# Patient Record
Sex: Male | Born: 1975 | Race: White | Hispanic: No | Marital: Single | Smoking: Current every day smoker
Health system: Southern US, Community
[De-identification: ages and names within clinical notes are randomized; demographics above are authoritative.]

## PROBLEM LIST (undated history)

## (undated) HISTORY — PX: KNEE SURGERY: SHX244

---

## 2016-04-28 ENCOUNTER — Encounter (HOSPITAL_COMMUNITY): Payer: Self-pay | Admitting: Internal Medicine

## 2016-04-28 ENCOUNTER — Observation Stay (HOSPITAL_COMMUNITY): Payer: No Typology Code available for payment source

## 2016-04-28 ENCOUNTER — Observation Stay (HOSPITAL_COMMUNITY)
Admission: AD | Admit: 2016-04-28 | Discharge: 2016-04-28 | Disposition: A | Discharge status: Home / Self Care | Payer: No Typology Code available for payment source | Source: Other Acute Inpatient Hospital | Attending: Internal Medicine | Admitting: Internal Medicine

## 2016-04-28 ENCOUNTER — Observation Stay (HOSPITAL_BASED_OUTPATIENT_CLINIC_OR_DEPARTMENT_OTHER): Payer: No Typology Code available for payment source

## 2016-04-28 DIAGNOSIS — F17200 Nicotine dependence, unspecified, uncomplicated: Secondary | ICD-10-CM | POA: Diagnosis not present

## 2016-04-28 DIAGNOSIS — R079 Chest pain, unspecified: Secondary | ICD-10-CM

## 2016-04-28 DIAGNOSIS — R0789 Other chest pain: Secondary | ICD-10-CM

## 2016-04-28 LAB — CBC WITH DIFFERENTIAL/PLATELET
BASOS ABS: 0 10*3/uL (ref 0.0–0.1)
Basophils Relative: 0 %
EOS ABS: 0.1 10*3/uL (ref 0.0–0.7)
Eosinophils Relative: 2 %
HCT: 41.7 % (ref 39.0–52.0)
HEMOGLOBIN: 14.2 g/dL (ref 13.0–17.0)
LYMPHS ABS: 3.3 10*3/uL (ref 0.7–4.0)
LYMPHS PCT: 47 %
MCH: 31.3 pg (ref 26.0–34.0)
MCHC: 34.1 g/dL (ref 30.0–36.0)
MCV: 92.1 fL (ref 78.0–100.0)
MONO ABS: 0.8 10*3/uL (ref 0.1–1.0)
Monocytes Relative: 11 %
NEUTROS ABS: 2.8 10*3/uL (ref 1.7–7.7)
Neutrophils Relative %: 40 %
PLATELETS: 192 10*3/uL (ref 150–400)
RBC: 4.53 MIL/uL (ref 4.22–5.81)
RDW: 12.6 % (ref 11.5–15.5)
WBC: 7 10*3/uL (ref 4.0–10.5)

## 2016-04-28 LAB — TROPONIN I: Troponin I: <0.03 ng/mL (ref <0.03)

## 2016-04-28 LAB — NM MYOCAR MULTI W/SPECT W/WALL MOTION / EF
CHL CUP MPHR: 180 {beats}/min
CSEPEW: 1 METS
CSEPHR: 58 %
CSEPPHR: 106 {beats}/min
Rest HR: 67 {beats}/min

## 2016-04-28 LAB — LIPID PANEL
CHOL/HDL RATIO: 6.3 ratio
Cholesterol: 169 mg/dL (ref 0–200)
HDL: 27 mg/dL — ABNORMAL LOW (ref >40)
LDL Cholesterol: 99 mg/dL (ref 0–99)
Triglycerides: 213 mg/dL — ABNORMAL HIGH (ref <150)
VLDL: 43 mg/dL — ABNORMAL HIGH (ref 0–40)

## 2016-04-28 LAB — MAGNESIUM: MAGNESIUM: 2 mg/dL (ref 1.7–2.4)

## 2016-04-28 MED ORDER — ACETAMINOPHEN 325 MG PO TABS
650.0000 mg | ORAL_TABLET | ORAL | Status: DC | PRN
  Administered 2016-04-28 (×2): 650 mg via ORAL
  Filled 2016-04-28 (×2): qty 2

## 2016-04-28 MED ORDER — GI COCKTAIL ~~LOC~~: 30.0000 mL | Freq: Three times a day (TID) | ORAL | Status: DC | PRN

## 2016-04-28 MED ORDER — PANTOPRAZOLE SODIUM 40 MG PO TBEC
40.0000 mg | DELAYED_RELEASE_TABLET | Freq: Every day | ORAL | Status: DC
  Administered 2016-04-28: 40 mg via ORAL
  Filled 2016-04-28: qty 1

## 2016-04-28 MED ORDER — REGADENOSON 0.4 MG/5ML IV SOLN
INTRAVENOUS | Status: AC
  Administered 2016-04-28: 0.4 mg via INTRAVENOUS
  Filled 2016-04-28: qty 5

## 2016-04-28 MED ORDER — MECLIZINE HCL 25 MG PO TABS: 25.0000 mg | ORAL_TABLET | Freq: Three times a day (TID) | ORAL | Status: DC | PRN

## 2016-04-28 MED ORDER — TECHNETIUM TC 99M TETROFOSMIN IV KIT
10.0000 | PACK | Freq: Once | INTRAVENOUS | Status: AC | PRN
  Administered 2016-04-28: 10 via INTRAVENOUS

## 2016-04-28 MED ORDER — TECHNETIUM TC 99M TETROFOSMIN IV KIT
30.0000 | PACK | Freq: Once | INTRAVENOUS | Status: AC | PRN
  Administered 2016-04-28: 30 via INTRAVENOUS

## 2016-04-28 MED ORDER — REGADENOSON 0.4 MG/5ML IV SOLN
0.4000 mg | Freq: Once | INTRAVENOUS | Status: AC
  Administered 2016-04-28: 0.4 mg via INTRAVENOUS
  Filled 2016-04-28: qty 5

## 2016-04-28 MED ORDER — PANTOPRAZOLE SODIUM 40 MG PO TBEC
40.0000 mg | DELAYED_RELEASE_TABLET | Freq: Every day | ORAL | 3 refills | Status: AC
Start: 2016-04-28 — End: ?

## 2016-04-28 MED ORDER — ENOXAPARIN SODIUM 60 MG/0.6ML ~~LOC~~ SOLN
60.0000 mg | SUBCUTANEOUS | Status: DC
  Administered 2016-04-28: 60 mg via SUBCUTANEOUS
  Filled 2016-04-28: qty 0.6

## 2016-04-28 MED ORDER — MORPHINE SULFATE (PF) 2 MG/ML IV SOLN
2.0000 mg | INTRAVENOUS | Status: DC | PRN
Start: 2016-04-28 — End: 2016-04-28

## 2016-04-28 MED ORDER — ONDANSETRON HCL 4 MG/2ML IJ SOLN: 4.0000 mg | Freq: Four times a day (QID) | INTRAMUSCULAR | Status: DC | PRN

## 2016-04-28 NOTE — Progress Notes (Signed)
I have seen and assessed patient and agree with Dr Boston Service assessment and plan. Patient has been seen by cardiology and patient for stress myoview today.

## 2016-04-28 NOTE — Discharge Summary (Signed)
Physician Discharge Summary  Jerry Cline ZOX:[id number] DOB: 04-06-1976 DOA: 04/28/2016  PCP: No PCP Per Patient  Admit date: 04/28/2016 Discharge date: 04/28/2016  Time spent: 65 minutes  Recommendations for Outpatient Follow-up:  1. Follow-up with PCP in 1-2 weeks.   Discharge Diagnoses:  Principal Problem:   Chest pain   Discharge Condition: Stable  Diet recommendation: Regular  Filed Weights   04/28/16 0257  Weight: 121.6 kg (268 lb)    History of present illness:  Per Dr Shirley Friar Jerry Cline is a 40 y.o. male with medical history significant of not seeing a doctor regularly, but healthy as far as he knows.  Patient presented to the ED at [facility] the evening of admission, for c/o chest pain.  Symptoms onset earlier in the day prior to admission, have been intermittent since onset.  Pain located in center left chest.  Quality is burning and dull.  Occasionally radiates to L arm.  No prior CP or cardiac work up in past, no SOB.  ED Course: Trop negative, EKG was read as 'PR depression'.  Patient transferred to [facility] due to cardiology, stress test, and 2d echo all being currently unavailable at [facility].  Hospital Course:  #1 chest pain Patient had presented with chest pain with radiation to the left upper extremity which seems somewhat atypical. Patient however had several risk factors for coronary artery disease including obesity, poor diet, unknown lipid levels patient also noted to be adopted. EKG done at outside hospital, showed early repolarization with a normal sinus rhythm and no ischemic changes noted. Patient was admitted to the telemetry floor. Cardiac enzymes which was cycled were negative 3. Fasting lipid panel obtained had a LDL of 99 and a total cholesterol 169. Patient was seen in consultation by cardiology who recommended a Myoview stress test. Patient remained chest pain-free for the rest of the hospitalization. Patient was also maintained on a  PPI. Myoview stress test came back with no reversible ischemia or infarction. Normal left ventricular wall motion. EF of 53%. Low risk stress test findings. Patient improved clinically patient was discharged home in stable and improved condition on a PPI. Patient is to follow-up with PCP in 1-2 weeks postdischarge.  Procedures:  CXR 04/28/2016  Myoview stress test 04/28/2016   Consultations:  Cardiology: Dr Elease Hashimoto 04/28/2016  Discharge Exam: Vitals:   04/28/16 1204 04/28/16 1549  BP: 115/84 118/77  Pulse:  78  Resp:  17  Temp:  98.4 F (36.9 C)    General: NAD Cardiovascular: RRR Respiratory: CTAB  Discharge Instructions   Discharge Instructions    Diet general    Complete by:  As directed   Discharge instructions    Complete by:  As directed   Follow up with PCP   Increase activity slowly    Complete by:  As directed     Current Discharge Medication List    START taking these medications   Details  pantoprazole (PROTONIX) 40 MG tablet Take 1 tablet (40 mg total) by mouth daily at 6 (six) AM. Qty: 30 tablet, Refills: 3       Allergies  Allergen Reactions  . Bee Venom Anaphylaxis   Follow-up Information    pcp. Schedule an appointment as soon as possible for a visit in 2 week(s).   Why:  f/u with PCP in 1-2 weeks.           The results of significant diagnostics from this hospitalization (including imaging, microbiology, ancillary and laboratory) are listed  below for reference.    Significant Diagnostic Studies: Dg Chest 2 View  Result Date: 04/28/2016 CLINICAL DATA:  Chest pain for 2 days EXAM: CHEST  2 VIEW COMPARISON:  04/27/2016 FINDINGS: The heart size and mediastinal contours are within normal limits. Both lungs are clear. The visualized skeletal structures are unremarkable. IMPRESSION: No active cardiopulmonary disease. Electronically Signed   By: Alcide Clever M.D.   On: 04/28/2016 09:56  Nm Myocar Multi W/spect W/wall Motion / Ef  Result Date:  04/28/2016 CLINICAL DATA:  Chest pain, tobacco abuse EXAM: MYOCARDIAL IMAGING WITH SPECT (REST AND PHARMACOLOGIC-STRESS) GATED LEFT VENTRICULAR WALL MOTION STUDY LEFT VENTRICULAR EJECTION FRACTION TECHNIQUE: Standard myocardial SPECT imaging was performed after resting intravenous injection of 10 mCi Tc-51m sestamibi. Subsequently, intravenous infusion of Lexiscan was performed under the supervision of the Cardiology staff. At peak effect of the drug, 30 mCi Tc-28m sestamibi was injected intravenously and standard myocardial SPECT imaging was performed. Quantitative gated imaging was also performed to evaluate left ventricular wall motion, and estimate left ventricular ejection fraction. COMPARISON:  None. FINDINGS: Perfusion: No decreased activity in the left ventricle on stress imaging to suggest reversible ischemia or infarction. Mild inferoseptal attenuation. Wall Motion: Normal left ventricular wall motion. No left ventricular dilation. Left Ventricular Ejection Fraction: 53 % End diastolic volume 129 ml End systolic volume 61 ml IMPRESSION: 1. No reversible ischemia or infarction. 2. Normal left ventricular wall motion. 3. Left ventricular ejection fraction 53% 4. Low-risk stress test findings*. *2012 Appropriate Use Criteria for Coronary Revascularization Focused Update: J Am Coll Cardiol. 2012;59(9):857-881. http://[email or web address] Electronically Signed   By: Corlis Leak M.D.   On: 04/28/2016 15:59    Microbiology: No results found for this or any previous visit (from the past 240 hour(s)).   Labs: Basic Metabolic Panel:  Recent Labs Lab 04/28/16 0925  MG 2.0   Liver Function Tests: No results for input(s): AST, ALT, ALKPHOS, BILITOT, PROT, ALBUMIN in the last 168 hours. No results for input(s): LIPASE, AMYLASE in the last 168 hours. No results for input(s): AMMONIA in the last 168 hours. CBC:  Recent Labs Lab 04/28/16 0925  WBC 7.0  NEUTROABS 2.8   HGB 14.2  HCT 41.7  MCV 92.1  PLT 192   Cardiac Enzymes:  Recent Labs Lab 04/28/16 0458 04/28/16 0656 04/28/16 0925 04/28/16 1453  TROPONINI <0.03 <0.03 <0.03 <0.03   BNP: BNP (last 3 results) No results for input(s): BNP in the last 8760 hours.  ProBNP (last 3 results) No results for input(s): PROBNP in the last 8760 hours.  CBG: No results for input(s): GLUCAP in the last 168 hours.     SignedRamiro Harvest MD.  [facility] 04/28/2016, 4:46 PM

## 2016-04-28 NOTE — H&P (Signed)
History and Physical    Jerry Cline ZOX:[id number] DOB: 15-Dec-1975 DOA: 04/28/2016   PCP: No PCP Per Patient  Chief Complaint: Chest pain  HPI: Jerry Cline is a 40 y.o. male with medical history significant of not seeing a doctor regularly, but healthy as far as he knows.  Patient presented to the ED at [facility] this evening for c/o chest pain.  Symptoms onset earlier in the day yesterday, have been intermittent since onset.  Pain located in center left chest.  Quality is burning and dull.  Occasionally radiates to L arm.  No prior CP or cardiac work up in past, no SOB.  ED Course: Trop negative, EKG was read as 'PR depression'.  Patient transferred to [facility] due to cardiology, stress test, and 2d echo all being currently unavailable at [facility].  Review of Systems: As per HPI otherwise 10 point review of systems negative.    History reviewed. No pertinent past medical history.  Past Surgical History:  Procedure Laterality Date  . KNEE SURGERY     x4     reports that he has been smoking.  He does not have any smokeless tobacco history on file. He reports that he drinks alcohol. He reports that he does not use drugs.  No Known Allergies  Family History  Problem Relation Age of Onset  . Adopted: Yes      Prior to Admission medications   Not on File    Physical Exam: Vitals:   04/28/16 0257  BP: 122/75  Pulse: 74  Resp: 14  Temp: 97.6 F (36.4 C)  TempSrc: Oral  SpO2: 99%  Weight: 121.6 kg (268 lb)  Height: 6' (1.829 m)      Constitutional: NAD, calm, comfortable Eyes: PERRL, lids and conjunctivae normal ENMT: Mucous membranes are moist. Posterior pharynx clear of any exudate or lesions.Normal dentition.  Neck: normal, supple, no masses, no thyromegaly Respiratory: clear to auscultation bilaterally, no wheezing, no crackles. Normal respiratory effort. No accessory muscle use.  Cardiovascular: Regular rate and rhythm, no murmurs / rubs /  gallops. No extremity edema. 2+ pedal pulses. No carotid bruits.  Abdomen: no tenderness, no masses palpated. No hepatosplenomegaly. Bowel sounds positive.  Musculoskeletal: no clubbing / cyanosis. No joint deformity upper and lower extremities. Good ROM, no contractures. Normal muscle tone.  Skin: no rashes, lesions, ulcers. No induration Neurologic: CN 2-12 grossly intact. Sensation intact, DTR normal. Strength 5/5 in all 4.  Psychiatric: Normal judgment and insight. Alert and oriented x 3. Normal mood.    Labs on Admission: I have personally reviewed following labs and imaging studies  CBC: No results for input(s): WBC, NEUTROABS, HGB, HCT, MCV, PLT in the last 168 hours. Basic Metabolic Panel: No results for input(s): NA, K, CL, CO2, GLUCOSE, BUN, CREATININE, CALCIUM, MG, PHOS in the last 168 hours. GFR: CrCl cannot be calculated (No order found.). Liver Function Tests: No results for input(s): AST, ALT, ALKPHOS, BILITOT, PROT, ALBUMIN in the last 168 hours. No results for input(s): LIPASE, AMYLASE in the last 168 hours. No results for input(s): AMMONIA in the last 168 hours. Coagulation Profile: No results for input(s): INR, PROTIME in the last 168 hours. Cardiac Enzymes: No results for input(s): CKTOTAL, CKMB, CKMBINDEX, TROPONINI in the last 168 hours. BNP (last 3 results) No results for input(s): PROBNP in the last 8760 hours. HbA1C: No results for input(s): HGBA1C in the last 72 hours. CBG: No results for input(s): GLUCAP in the last 168 hours. Lipid Profile:  No results for input(s): CHOL, HDL, LDLCALC, TRIG, CHOLHDL, LDLDIRECT in the last 72 hours. Thyroid Function Tests: No results for input(s): TSH, T4TOTAL, FREET4, T3FREE, THYROIDAB in the last 72 hours. Anemia Panel: No results for input(s): VITAMINB12, FOLATE, FERRITIN, TIBC, IRON, RETICCTPCT in the last 72 hours. Urine analysis: No results found for: COLORURINE, APPEARANCEUR, LABSPEC, PHURINE, GLUCOSEU, HGBUR,  BILIRUBINUR, KETONESUR, PROTEINUR, UROBILINOGEN, NITRITE, LEUKOCYTESUR Sepsis Labs: @LABRCNTIP (procalcitonin:4,lacticidven:4) )No results found for this or any previous visit (from the past 240 hour(s)).   Radiological Exams on Admission: No results found.  EKG: Independently reviewed.  I personally reviewed the outside EKGs, no obvious STEMI  Assessment/Plan Principal Problem:   Chest pain   Chest pain -  CP obs pathway  Serial trops  NPO  Tele monitor  Stress test in AM  Morphine PRN pain   DVT prophylaxis: Lovenox Code Status: Full Family Communication: No family in room Consults called: None Admission status: Admit to obs   Hillary Bow DO [facility] Pager [phone] from 7PM-7AM  If 7AM-7PM, please contact the day physician for the patient [email or web address] Password [email or web address]  04/28/2016, 4:30 AM

## 2016-04-28 NOTE — Discharge Instructions (Signed)
Heart-Healthy Eating Plan °Many factors influence your heart health, including eating and exercise habits. Heart (coronary) risk increases with abnormal blood fat (lipid) levels. Heart-healthy meal planning includes limiting unhealthy fats, increasing healthy fats, and making other small dietary changes. This includes maintaining a healthy body weight to help keep lipid levels within a normal range. °WHAT IS MY PLAN?  °Your health care provider recommends that you: °· Get no more than _________% of the total calories in your daily diet from fat. °· Limit your intake of saturated fat to less than _________% of your total calories each day. °· Limit the amount of cholesterol in your diet to less than _________ mg per day. °WHAT TYPES OF FAT SHOULD I CHOOSE? °· Choose healthy fats more often. Choose monounsaturated and polyunsaturated fats, such as olive oil and canola oil, flaxseeds, walnuts, almonds, and seeds. °· Eat more omega-3 fats. Good choices include salmon, mackerel, sardines, tuna, flaxseed oil, and ground flaxseeds. Aim to eat fish at least two times each week. °· Limit saturated fats. Saturated fats are primarily found in animal products, such as meats, butter, and cream. Plant sources of saturated fats include palm oil, palm kernel oil, and coconut oil. °· Avoid foods with partially hydrogenated oils in them. These contain trans fats. Examples of foods that contain trans fats are stick margarine, some tub margarines, cookies, crackers, and other baked goods. °WHAT GENERAL GUIDELINES DO I NEED TO FOLLOW? °· Check food labels carefully to identify foods with trans fats or high amounts of saturated fat. °· Fill one half of your plate with vegetables and green salads. Eat 4-5 servings of vegetables per day. A serving of vegetables equals 1 cup of raw leafy vegetables, ½ cup of raw or cooked cut-up vegetables, or ½ cup of vegetable juice. °· Fill one fourth of your plate with whole grains. Look for the word  "whole" as the first word in the ingredient list. °· Fill one fourth of your plate with lean protein foods. °· Eat 4-5 servings of fruit per day. A serving of fruit equals one medium whole fruit, ¼ cup of dried fruit, ½ cup of fresh, frozen, or canned fruit, or ½ cup of 100% fruit juice. °· Eat more foods that contain soluble fiber. Examples of foods that contain this type of fiber are apples, broccoli, carrots, beans, peas, and barley. Aim to get 20-30 g of fiber per day. °· Eat more home-cooked food and less restaurant, buffet, and fast food. °· Limit or avoid alcohol. °· Limit foods that are high in starch and sugar. °· Avoid fried foods. °· Cook foods by using methods other than frying. Baking, boiling, grilling, and broiling are all great options. Other fat-reducing suggestions include: °¨ Removing the skin from poultry. °¨ Removing all visible fats from meats. °¨ Skimming the fat off of stews, soups, and gravies before serving them. °¨ Steaming vegetables in water or broth. °· Lose weight if you are overweight. Losing just 5-10% of your initial body weight can help your overall health and prevent diseases such as diabetes and heart disease. °· Increase your consumption of nuts, legumes, and seeds to 4-5 servings per week. One serving of dried beans or legumes equals ½ cup after being cooked, one serving of nuts equals 1½ ounces, and one serving of seeds equals ½ ounce or 1 tablespoon. °· You may need to monitor your salt (sodium) intake, especially if you have high blood pressure. Talk with your health care provider or dietitian to get   more information about reducing sodium. °WHAT FOODS CAN I EAT? °Grains °Breads, including French, white, pita, wheat, raisin, rye, oatmeal, and Italian. Tortillas that are neither fried nor made with lard or trans fat. Low-fat rolls, including hotdog and hamburger buns and English muffins. Biscuits. Muffins. Waffles. Pancakes. Light popcorn. Whole-grain cereals. Flatbread. Melba  toast. Pretzels. Breadsticks. Rusks. Low-fat snacks and crackers, including oyster, saltine, matzo, graham, animal, and rye. Rice and pasta, including brown rice and those that are made with whole wheat. °Vegetables °All vegetables. °Fruits °All fruits, but limit coconut. °Meats and Other Protein Sources °Lean, well-trimmed beef, veal, pork, and lamb. Chicken and turkey without skin. All fish and shellfish. Wild duck, rabbit, pheasant, and venison. Egg whites or low-cholesterol egg substitutes. Dried beans, peas, lentils, and tofu. Seeds and most nuts. °Dairy °Low-fat or nonfat cheeses, including ricotta, string, and mozzarella. Skim or 1% milk that is liquid, powdered, or evaporated. Buttermilk that is made with low-fat milk. Nonfat or low-fat yogurt. °Beverages °Mineral water. Diet carbonated beverages. °Sweets and Desserts °Sherbets and fruit ices. Honey, jam, marmalade, jelly, and syrups. Meringues and gelatins. Pure sugar candy, such as hard candy, jelly beans, gumdrops, mints, marshmallows, and small amounts of dark chocolate. Angel food cake. °Eat all sweets and desserts in moderation. °Fats and Oils °Nonhydrogenated (trans-free) margarines. Vegetable oils, including soybean, sesame, sunflower, olive, peanut, safflower, corn, canola, and cottonseed. Salad dressings or mayonnaise that are made with a vegetable oil. Limit added fats and oils that you use for cooking, baking, salads, and as spreads. °Other °Cocoa powder. Coffee and tea. All seasonings and condiments. °The items listed above may not be a complete list of recommended foods or beverages. Contact your dietitian for more options. °WHAT FOODS ARE NOT RECOMMENDED? °Grains °Breads that are made with saturated or trans fats, oils, or whole milk. Croissants. Butter rolls. Cheese breads. Sweet rolls. Donuts. Buttered popcorn. Chow mein noodles. High-fat crackers, such as cheese or butter crackers. °Meats and Other Protein Sources °Fatty meats, such as  hotdogs, short ribs, sausage, spareribs, bacon, ribeye roast or steak, and mutton. High-fat deli meats, such as salami and bologna. Caviar. Domestic duck and goose. Organ meats, such as kidney, liver, sweetbreads, brains, gizzard, chitterlings, and heart. °Dairy °Cream, sour cream, cream cheese, and creamed cottage cheese. Whole milk cheeses, including blue (bleu), Monterey Jack, Brie, Colby, American, Havarti, Swiss, cheddar, Camembert, and [place].  Whole or 2% milk that is liquid, evaporated, or condensed. Whole buttermilk. Cream sauce or high-fat cheese sauce. Yogurt that is made from whole milk. °Beverages °Regular sodas and drinks with added sugar. °Sweets and Desserts °Frosting. Pudding. Cookies. Cakes other than angel food cake. Candy that has milk chocolate or white chocolate, hydrogenated fat, butter, coconut, or unknown ingredients. Buttered syrups. Full-fat ice cream or ice cream drinks. °Fats and Oils °Gravy that has suet, meat fat, or shortening. Cocoa butter, hydrogenated oils, palm oil, coconut oil, palm kernel oil. These can often be found in baked products, candy, fried foods, nondairy creamers, and whipped toppings. Solid fats and shortenings, including bacon fat, salt pork, lard, and butter. Nondairy cream substitutes, such as coffee creamers and sour cream substitutes. Salad dressings that are made of unknown oils, cheese, or sour cream. °The items listed above may not be a complete list of foods and beverages to avoid. Contact your dietitian for more information. °  °This information is not intended to replace advice given to you by your health care provider. Make sure you discuss any questions you have with your health   care provider. °  °Document Released: 06/24/2008 Document Revised: 10/06/2014 Document Reviewed: 03/09/2014 °[employer]vier Interactive Patient Education ©2016 [employer] ° °

## 2016-04-28 NOTE — Consult Note (Signed)
CONSULT NOTE  Date: 04/28/2016               Patient Name:  Jerry Cline MRN: [id number]  DOB: October 27, 1975 Age / Sex: 40 y.o., male        PCP: No PCP Per Patient Primary Cardiologist: Luvenia Heller            Referring Physician:  Janee Morn               Reason for Consult: Chest pain            History of Present Illness: Patient is a 40 y.o. male with no significant PMH , who was admitted to [facility] via transfer from [facility] on 04/28/2016 for evaluation of  Chest pain.    .     CP - mid sternal CP, stabbing pain ,  Occurred yesterday while swallowing ,   Radiation to left arm. Clammy sensation Last for hours  Has had similar episodes with exertion over the past several weeks.   No syncope, No PND or orthopnea + diaphoresis  Does not do any exercise Fairly active - works as a Designer, television/film set 1 ppd. Eats a very fatty diet.   Medications: Outpatient medications: No prescriptions prior to admission.    Current medications: Current Facility-Administered Medications  Medication Dose Route Frequency Provider Last Rate Last Dose  . acetaminophen (TYLENOL) tablet 650 mg  650 mg Oral Q4H PRN Hillary Bow, DO   650 mg at 04/28/16 4098  . enoxaparin (LOVENOX) injection 60 mg  60 mg Subcutaneous Q24H Hillary Bow, DO   60 mg at 04/28/16 1191  . gi cocktail (Maalox,Lidocaine,Donnatal)  30 mL Oral TID PRN Rodolph Bong, MD      . morphine 2 MG/ML injection 2 mg  2 mg Intravenous Q2H PRN Hillary Bow, DO      . ondansetron [facility]) injection 4 mg  4 mg Intravenous Q6H PRN Hillary Bow, DO      . pantoprazole (PROTONIX) EC tablet 40 mg  40 mg Oral Q0600 Rodolph Bong, MD         No Known Allergies   History reviewed. No pertinent past medical history.  Past Surgical History:  Procedure Laterality Date  . KNEE SURGERY     x4    Family History  Problem Relation Age of Onset  . Adopted: Yes    Social History:  reports that  he has been smoking.  He does not have any smokeless tobacco history on file. He reports that he drinks alcohol. He reports that he does not use drugs.   Review of Systems: Constitutional:  denies fever, chills, diaphoresis, appetite change and fatigue.  HEENT: denies photophobia, eye pain, redness, hearing loss, ear pain, congestion, sore throat, rhinorrhea, sneezing, neck pain, neck stiffness and tinnitus.  Respiratory: denies SOB, DOE, cough, chest tightness, and wheezing.  Cardiovascular: admits to chest pain,   Gastrointestinal: denies nausea, vomiting, abdominal pain, diarrhea, constipation, blood in stool.  Genitourinary: denies dysuria, urgency, frequency, hematuria, flank pain and difficulty urinating.  Musculoskeletal: denies  myalgias, back pain, joint swelling, arthralgias and gait problem.   Skin: denies pallor, rash and wound.  Neurological: denies dizziness, seizures, syncope, weakness, light-headedness, numbness and headaches.   Hematological: denies adenopathy, easy bruising, personal or family bleeding history.  Psychiatric/ Behavioral: denies suicidal ideation, mood changes, confusion, nervousness, sleep disturbance and agitation.    Physical Exam: BP 123/78 (BP  Location: Right Arm)   Pulse 79   Temp 97.9 F (36.6 C) (Oral)   Resp 17   Ht 6' (1.829 m)   Wt 268 lb (121.6 kg)   SpO2 99%   BMI 36.35 kg/m   Wt Readings from Last 3 Encounters:  04/28/16 268 lb (121.6 kg)    General: Vital signs reviewed and noted.  Moderately obese male, NAD  Head: Normocephalic, atraumatic, sclera anicteric,   Neck: Supple. Negative for carotid bruits. No JVD   Lungs:  Clear bilaterally, no  wheezes, rales, or rhonchi. Breathing is normal   Heart: RRR with S1 S2. No murmurs, rubs, or gallops   Abdomen/ GI :  Soft, non-tender, non-distended with normoactive bowel sounds. No hepatomegaly. No rebound/guarding.  obese  MSK: Strength and the appear normal for age.   Extremities: No  clubbing or cyanosis. No edema.  Distal pedal pulses are 2+ and equal   Neurologic:  CN are grossly intact,  No obvious motor or sensory defect.  Alert and oriented X 3. Moves all extremities spontaneously.  Psych: Responds to questions appropriately with a normal affect.     Lab results:  Basic Metabolic Panel: No results for input(s): NA, K, CL, CO2, GLUCOSE, BUN, CREATININE, CALCIUM, MG, PHOS in the last 168 hours.  Liver Function Tests: No results for input(s): AST, ALT, ALKPHOS, BILITOT, PROT, ALBUMIN in the last 168 hours. No results for input(s): LIPASE, AMYLASE in the last 168 hours. No results for input(s): AMMONIA in the last 168 hours.  CBC: No results for input(s): WBC, NEUTROABS, HGB, HCT, MCV, PLT in the last 168 hours.  Cardiac Enzymes:  Recent Labs Lab 04/28/16 0458 04/28/16 0656  TROPONINI <0.03 <0.03    BNP: Invalid input(s): POCBNP  CBG: No results for input(s): GLUCAP in the last 168 hours.  Coagulation Studies: No results for input(s): LABPROT, INR in the last 72 hours.   Other results:  Personal review of EKG from [facility] shows :    NSR , early repol.  No ST or T wave changes.    Imaging:      Assessment & Plan:  1. Chest pain:   Pt is several risk factors for CAD Obesity, poor diet ( does not know his lipids levels) Unknown family hx ( adopted. )  Character of CP is somewhat atypical but there are some concerning aspects.   Will get a stress myoview today;  Will check lipids today         Alvia Grove., MD, [facility] 04/28/2016, 8:22 AM Office - [phone] Pager [phone]
